# Patient Record
Sex: Female | Born: 1995 | Race: Black or African American | Hispanic: No | Marital: Single
Health system: Southern US, Community
[De-identification: ages and names within clinical notes are randomized; demographics above are authoritative.]

---
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
## 2016-04-06 DIAGNOSIS — L923 Foreign body granuloma of the skin and subcutaneous tissue: Secondary | ICD-10-CM | POA: Diagnosis not present

## 2016-04-08 DIAGNOSIS — L923 Foreign body granuloma of the skin and subcutaneous tissue: Secondary | ICD-10-CM | POA: Diagnosis not present

## 2016-05-21 DIAGNOSIS — L923 Foreign body granuloma of the skin and subcutaneous tissue: Secondary | ICD-10-CM | POA: Diagnosis not present

## 2016-07-11 DIAGNOSIS — Z0100 Encounter for examination of eyes and vision without abnormal findings: Secondary | ICD-10-CM | POA: Diagnosis not present

## 2016-08-19 DIAGNOSIS — L923 Foreign body granuloma of the skin and subcutaneous tissue: Secondary | ICD-10-CM | POA: Diagnosis not present

## 2016-08-19 DIAGNOSIS — Z23 Encounter for immunization: Secondary | ICD-10-CM | POA: Diagnosis not present

## 2016-09-15 MED FILL — CLOBETASOL 0.05% OINTMENT: 0.05 | 10 days supply | Qty: 15 | Fill #0

## 2016-11-02 DIAGNOSIS — Z23 Encounter for immunization: Secondary | ICD-10-CM | POA: Diagnosis not present

## 2016-11-02 DIAGNOSIS — Z0000 Encounter for general adult medical examination without abnormal findings: Secondary | ICD-10-CM | POA: Diagnosis not present

## 2018-03-11 ENCOUNTER — Other Ambulatory Visit: Payer: Self-pay | Admitting: Family Medicine

## 2018-03-16 LAB — CYTOLOGY - PAP: DIAGNOSIS: NEGATIVE

## 2020-09-05 ENCOUNTER — Ambulatory Visit: Payer: Self-pay | Attending: Internal Medicine

## 2020-09-05 DIAGNOSIS — Z23 Encounter for immunization: Secondary | ICD-10-CM

## 2020-09-05 NOTE — Progress Notes (Signed)
   [facility]  Name:  Heidi Mcneil    MRN: [id number] DOB: [birth year]  09/05/2020  Heidi Mcneil was observed post Covid-19 immunization for 15 minutes without incident. She was provided with Vaccine Information Sheet and instruction to access the V-Safe system.   Heidi Mcneil was instructed to call 911 with any severe reactions post vaccine: Marland Kitchen Difficulty breathing  . Swelling of face and throat  . A fast heartbeat  . A bad rash all over body  . Dizziness and weakness   Immunizations Administered    Name Date Dose VIS Date Route   Pfizer COVID-19 Vaccine 09/05/2020  3:57 PM 0.3 mL 08/07/2020 Intramuscular   Manufacturer: [employer], [employer]   Lot: [id number]   NDC: [address]-7

## 2021-11-14 ENCOUNTER — Other Ambulatory Visit (HOSPITAL_COMMUNITY): Payer: Self-pay

## 2021-11-14 MED ORDER — NAPROXEN 500 MG PO TABS
ORAL_TABLET | ORAL | 2 refills | Status: AC
  Filled 2021-11-14: qty 60, 30d supply, fill #0

## 2021-11-17 ENCOUNTER — Other Ambulatory Visit (HOSPITAL_COMMUNITY): Payer: Self-pay

## 2021-11-22 ENCOUNTER — Other Ambulatory Visit (HOSPITAL_COMMUNITY): Payer: Self-pay

## 2021-11-22 MED ORDER — CARESTART COVID-19 HOME TEST VI KIT
PACK | 0 refills | Status: AC
  Filled 2021-11-22: qty 4, 4d supply, fill #0

## 2023-01-07 DIAGNOSIS — H52223 Regular astigmatism, bilateral: Secondary | ICD-10-CM | POA: Diagnosis not present

## 2023-07-13 DIAGNOSIS — Z0000 Encounter for general adult medical examination without abnormal findings: Secondary | ICD-10-CM | POA: Diagnosis not present

## 2023-07-13 DIAGNOSIS — Z23 Encounter for immunization: Secondary | ICD-10-CM | POA: Diagnosis not present

## 2023-07-13 DIAGNOSIS — Z13220 Encounter for screening for lipoid disorders: Secondary | ICD-10-CM | POA: Diagnosis not present

## 2024-07-18 DIAGNOSIS — E7800 Pure hypercholesterolemia, unspecified: Secondary | ICD-10-CM | POA: Diagnosis not present

## 2024-07-18 DIAGNOSIS — Z0000 Encounter for general adult medical examination without abnormal findings: Secondary | ICD-10-CM | POA: Diagnosis not present

## 2024-07-18 DIAGNOSIS — Z23 Encounter for immunization: Secondary | ICD-10-CM | POA: Diagnosis not present

## 2024-10-05 DIAGNOSIS — J029 Acute pharyngitis, unspecified: Secondary | ICD-10-CM | POA: Diagnosis not present

## 2024-10-05 DIAGNOSIS — H6123 Impacted cerumen, bilateral: Secondary | ICD-10-CM | POA: Diagnosis not present
